# Patient Record
Sex: Female | Born: 2000 | Race: Black or African American | Hispanic: No | Marital: Single | Smoking: Never smoker
Health system: Southern US, Community
[De-identification: ages and names within clinical notes are randomized; demographics above are authoritative.]

## PROBLEM LIST (undated history)

## (undated) DIAGNOSIS — J45909 Unspecified asthma, uncomplicated: Secondary | ICD-10-CM

---
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
## 2023-05-23 ENCOUNTER — Ambulatory Visit
Admission: EM | Admit: 2023-05-23 | Discharge: 2023-05-23 | Disposition: A | Discharge status: Home / Self Care | Payer: Managed Care, Other (non HMO) | Attending: Family Medicine | Admitting: Family Medicine

## 2023-05-23 ENCOUNTER — Encounter: Payer: Self-pay | Admitting: Emergency Medicine

## 2023-05-23 DIAGNOSIS — R21 Rash and other nonspecific skin eruption: Secondary | ICD-10-CM | POA: Diagnosis not present

## 2023-05-23 MED ORDER — PREDNISONE 10 MG PO TABS: ORAL_TABLET | ORAL | 0 refills | Status: AC

## 2023-05-23 MED ORDER — FAMOTIDINE 20 MG PO TABS: 20.0000 mg | ORAL_TABLET | Freq: Two times a day (BID) | ORAL | 0 refills | Status: AC

## 2023-05-23 MED ORDER — HYDROXYZINE HCL 25 MG PO TABS: 25.0000 mg | ORAL_TABLET | Freq: Three times a day (TID) | ORAL | 0 refills | Status: AC | PRN

## 2023-05-23 NOTE — ED Provider Notes (Signed)
[facility]    CSN: [id number] Arrival date & time: 05/23/23  1413      History   Chief Complaint Chief Complaint  Patient presents with   Rash    HPI  22 year old female presents for evaluation of rash.  2-week history of rash.  Located on the arms, legs, hands, face, and back.  Recently saw a local urgent care.  Was thought to be contact dermatitis due to a change in laundry detergent.  She has been back to her old laundry detergent.  No other changes or exposures.  She was given Benadryl and topical steroid and has been using it without relief.  Home Medications    Prior to Admission medications   Medication Sig Start Date End Date Taking? Authorizing Provider  famotidine (PEPCID) 20 MG tablet Take 1 tablet (20 mg total) by mouth 2 (two) times daily. 05/23/23  Yes Ori Kreiter G, DO  hydrOXYzine (ATARAX) 25 MG tablet Take 1 tablet (25 mg total) by mouth every 8 (eight) hours as needed for itching. 05/23/23  Yes Leba Tibbitts G, DO  predniSONE (DELTASONE) 10 MG tablet 50 mg daily x 2 days, then 40 mg daily x 2 days, then 30 mg daily x 2 days, then 20 mg daily x 2 days, then 10 mg daily x 2 days. 05/23/23  Yes Kaleisha Bhargava G, DO  ZEPBOUND 2.5 MG/0.5ML Pen Inject into the skin. 05/21/23  Yes [provider]    Family History History reviewed. No pertinent family history.  Social History Social History   Tobacco Use   Smoking status: Never   Smokeless tobacco: Never  Vaping Use   Vaping status: Every Day  Substance Use Topics   Alcohol use: Not Currently   Drug use: Never     Allergies   Penicillins   Review of Systems Review of Systems  Skin:  Positive for rash.     Physical Exam Triage Vital Signs ED Triage Vitals  Encounter Vitals Group     BP 05/23/23 1448 116/73     Systolic BP Percentile --      Diastolic BP Percentile --      Pulse Rate 05/23/23 1448 84     Resp 05/23/23 1448 16     Temp 05/23/23 1448 99 F (37.2 C)     Temp  Source 05/23/23 1448 Oral     SpO2 05/23/23 1448 96 %     Weight 05/23/23 1445 (!) 317 lb 10.9 oz (144.1 kg)     Height 05/23/23 1445 5\' 7"  (1.702 m)     Head Circumference --      Peak Flow --      Pain Score 05/23/23 1444 0     Pain Loc --      Pain Education --      Exclude from Growth Chart --    No data found.  Updated Vital Signs BP 116/73 (BP Location: Left Arm)   Pulse 84   Temp 99 F (37.2 C) (Oral)   Resp 16   Ht 5\' 7"  (1.702 m)   Wt (!) 144.1 kg   LMP 05/09/2023   SpO2 96%   BMI 49.76 kg/m   Visual Acuity Right Eye Distance:   Left Eye Distance:   Bilateral Distance:    Right Eye Near:   Left Eye Near:    Bilateral Near:     Physical Exam Vitals and nursing note reviewed.  Constitutional:      General: She  is not in acute distress.    Appearance: Normal appearance. She is obese.  HENT:     Head: Normocephalic and atraumatic.  Skin:    Comments: Erythematous papular rash noted to the upper extremities as well as the back.  Neurological:     Mental Status: She is alert.      UC Treatments / Results  Labs (all labs ordered are listed, but only abnormal results are displayed) Labs Reviewed - No data to display  EKG   Radiology No results found.  Procedures Procedures (including critical care time)  Medications Ordered in UC Medications - No data to display  Initial Impression / Assessment and Plan / UC Course  I have reviewed the triage vital signs and the nursing notes.  Pertinent labs & imaging results that were available during my care of the patient were reviewed by me and considered in my medical decision making (see chart for details).    22 year old female presents with rash.  Treating with prednisone, Pepcid, hydroxyzine.  Advised to see dermatology if fails to improve.  Final Clinical Impressions(s) / UC Diagnoses   Final diagnoses:  Rash     Discharge Instructions      Medications as prescribed.  If continues to  persist, see [employer].  [address] [place], [place] [address] [facility] at [facility] [phone]   ED Prescriptions     Medication Sig Dispense Auth. Provider   predniSONE (DELTASONE) 10 MG tablet 50 mg daily x 2 days, then 40 mg daily x 2 days, then 30 mg daily x 2 days, then 20 mg daily x 2 days, then 10 mg daily x 2 days. 30 tablet Calliope Delangel G, DO   famotidine (PEPCID) 20 MG tablet Take 1 tablet (20 mg total) by mouth 2 (two) times daily. 28 tablet Errica Dutil G, DO   hydrOXYzine (ATARAX) 25 MG tablet Take 1 tablet (25 mg total) by mouth every 8 (eight) hours as needed for itching. 30 tablet Tommie Sams, DO      PDMP not reviewed this encounter.   Tommie Sams, [place] 05/23/23 1628

## 2023-05-23 NOTE — Discharge Instructions (Signed)
Medications as prescribed.  If continues to persist, see [employer].  [address] [place], [place] [address] [facility] at [facility]. [phone]

## 2023-05-23 NOTE — ED Triage Notes (Signed)
Pt c/o rash on bilateral arms, legs, hands, face and back. Started about 2 weeks ago. She has been treated by her pcp with creams and taking benadryl without relief. She states the rash is itchy. She has changed laundry detergents but has since gone back to the old laundry detergent and rash has still not gone away.
